# Patient Record
Sex: Female | Born: 1970 | Race: White | Hispanic: No | Marital: Married | Smoking: Never smoker
Health system: Southern US, Community
[De-identification: ages and names within clinical notes are randomized; demographics above are authoritative.]

---
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
## 1994-03-11 HISTORY — PX: OVARIAN CYST REMOVAL: SHX89

## 1999-05-24 ENCOUNTER — Inpatient Hospital Stay (HOSPITAL_COMMUNITY): Admission: AD | Admit: 1999-05-24 | Discharge: 1999-05-24 | Payer: Self-pay | Admitting: *Deleted

## 1999-08-29 ENCOUNTER — Inpatient Hospital Stay (HOSPITAL_COMMUNITY): Admission: AD | Admit: 1999-08-29 | Discharge: 1999-08-31 | Payer: Self-pay | Admitting: *Deleted

## 2000-11-21 ENCOUNTER — Other Ambulatory Visit: Admission: RE | Admit: 2000-11-21 | Discharge: 2000-11-21 | Payer: Self-pay | Admitting: *Deleted

## 2003-02-02 ENCOUNTER — Other Ambulatory Visit: Admission: RE | Admit: 2003-02-02 | Discharge: 2003-02-02 | Payer: Self-pay | Admitting: *Deleted

## 2003-08-09 ENCOUNTER — Inpatient Hospital Stay (HOSPITAL_COMMUNITY): Admission: AD | Admit: 2003-08-09 | Discharge: 2003-08-11 | Payer: Self-pay | Admitting: *Deleted

## 2003-09-27 ENCOUNTER — Other Ambulatory Visit: Admission: RE | Admit: 2003-09-27 | Discharge: 2003-09-27 | Payer: Self-pay | Admitting: Obstetrics and Gynecology

## 2007-12-11 ENCOUNTER — Encounter: Admission: RE | Admit: 2007-12-11 | Discharge: 2007-12-11 | Payer: Self-pay | Admitting: Family Medicine

## 2012-07-23 DIAGNOSIS — G43909 Migraine, unspecified, not intractable, without status migrainosus: Secondary | ICD-10-CM | POA: Insufficient documentation

## 2012-07-23 DIAGNOSIS — D693 Immune thrombocytopenic purpura: Secondary | ICD-10-CM | POA: Insufficient documentation

## 2016-05-16 ENCOUNTER — Encounter: Payer: Self-pay | Admitting: Osteopathic Medicine

## 2016-05-16 ENCOUNTER — Ambulatory Visit (INDEPENDENT_AMBULATORY_CARE_PROVIDER_SITE_OTHER): Payer: BC Managed Care – PPO | Admitting: Osteopathic Medicine

## 2016-05-16 ENCOUNTER — Ambulatory Visit (INDEPENDENT_AMBULATORY_CARE_PROVIDER_SITE_OTHER): Payer: BC Managed Care – PPO

## 2016-05-16 VITALS — BP 145/85 | HR 82 | Ht 67.0 in | Wt 179.0 lb

## 2016-05-16 DIAGNOSIS — K828 Other specified diseases of gallbladder: Secondary | ICD-10-CM

## 2016-05-16 DIAGNOSIS — R7989 Other specified abnormal findings of blood chemistry: Secondary | ICD-10-CM

## 2016-05-16 DIAGNOSIS — R1011 Right upper quadrant pain: Secondary | ICD-10-CM

## 2016-05-16 DIAGNOSIS — K8050 Calculus of bile duct without cholangitis or cholecystitis without obstruction: Secondary | ICD-10-CM

## 2016-05-16 LAB — CBC WITH DIFFERENTIAL/PLATELET
BASOS PCT: 0 %
Basophils Absolute: 0 cells/uL (ref 0–200)
EOS PCT: 1 %
Eosinophils Absolute: 68 cells/uL (ref 15–500)
HCT: 41.8 % (ref 35.0–45.0)
Hemoglobin: 14.1 g/dL (ref 11.7–15.5)
Lymphocytes Relative: 41 %
Lymphs Abs: 2788 cells/uL (ref 850–3900)
MCH: 31.3 pg (ref 27.0–33.0)
MCHC: 33.7 g/dL (ref 32.0–36.0)
MCV: 92.9 fL (ref 80.0–100.0)
MONOS PCT: 7 %
MPV: 11.3 fL (ref 7.5–12.5)
Monocytes Absolute: 476 cells/uL (ref 200–950)
NEUTROS ABS: 3468 {cells}/uL (ref 1500–7800)
Neutrophils Relative %: 51 %
PLATELETS: 170 10*3/uL (ref 140–400)
RBC: 4.5 MIL/uL (ref 3.80–5.10)
RDW: 13.7 % (ref 11.0–15.0)
WBC: 6.8 10*3/uL (ref 3.8–10.8)

## 2016-05-16 NOTE — Progress Notes (Signed)
HPI: Amy Stafford is a 46 y.o. female  who [facility], 05/[facility]/16,  for chief complaint of:  Chief Complaint  Patient presents with  . Establish Care    RUQ PAIN    . Location: RUQ radiating into RLQ and occasionally into R shoulder  . Quality: sore, bloating . Duration: 2 years total, on and off. Worse over past 2 weeks.  . Modifying factors: worse w/ fatty foods  . Assoc signs/symptoms: Occasional constipation.     Past medical, surgical, social and family history reviewed: Patient Active Problem List   Diagnosis Date Noted  . Idiopathic thrombocytopenic purpura (HCC) 07/23/2012  . Migraine 07/23/2012   No past surgical history on file. Social History  Substance Use Topics  . Smoking status: Not on file  . Smokeless tobacco: Not on file  . Alcohol use Not on file   No family history on file.   Current medication list and allergy/intolerance information reviewed:   No current outpatient prescriptions on file.   No current facility-administered medications for this visit.    Allergies not on file    Review of Systems:  Constitutional:  No  fever, no chills, No recent illness, No unintentional weight changes. No significant fatigue.   HEENT: No  headache, no vision change, no hearing change, No sore throat, No  sinus pressure  Cardiac: No  chest pain, No  pressure, No palpitations  Respiratory:  No  shortness of breath. No  Cough  Gastrointestinal: No  abdominal pain, No  nausea  Musculoskeletal: No new myalgia/arthralgia  Skin: No  Rash, No other wounds/concerning lesions   Neurologic: No  weakness, No  dizziness  Psychiatric: No  concerns with depression, No  concerns with anxiety, No sleep problems, No mood problems  Exam:  BP (!) 145/85   Pulse 82   Ht 5\' 7"  (1.702 m)   Wt 179 lb (81.2 kg)   BMI 28.04 kg/m   Constitutional: VS see above. General Appearance: alert, well-developed,  well-nourished, NAD  Eyes: Normal lids and conjunctive, non-icteric sclera  Ears, Nose, Mouth, Throat: MMM, Normal external inspection ears/nares/mouth/lips/gums.   Neck: No masses, trachea midline. No thyroid enlargement.  Respiratory: Normal respiratory effort. no wheeze, no rhonchi, no rales  Cardiovascular: S1/S2 normal, no murmur, no rub/gallop auscultated. RRR.  Gastrointestinal: Nontender, no masses. No hepatomegaly, no splenomegaly. No hernia appreciated. Bowel sounds normal. Rectal exam deferred.   Musculoskeletal: Gait normal. No clubbing/cyanosis of digits.   Neurological: Normal balance/coordination. No tremor.  Skin: warm, dry, intact. No rash/ulcer.  Psychiatric: Normal judgment/insight. Normal mood and affect. Oriented x3.    ASSESSMENT/PLAN:   RUQ pain - Plan: US Abdomen Limited RUQ, CBC with Differential/Platelet, COMPLETE METABOLIC PANEL WITH GFR, TSH, Gamma GT    Visit summary with medication list and pertinent instructions was printed for patient to review. All questions at time of visit were answered - patient instructed to contact office with any additional concerns. ER/RTC precautions were reviewed with the patient. Follow-up plan: Return if symptoms worsen or fail to improve.

## 2016-05-17 LAB — GAMMA GT: GGT: 19 U/L (ref 7–51)

## 2016-05-17 LAB — COMPLETE METABOLIC PANEL WITH GFR
ALT: 13 U/L (ref 6–29)
AST: 13 U/L (ref 10–35)
Albumin: 4.7 g/dL (ref 3.6–5.1)
Alkaline Phosphatase: 43 U/L (ref 33–115)
BILIRUBIN TOTAL: 0.6 mg/dL (ref 0.2–1.2)
BUN: 15 mg/dL (ref 7–25)
CHLORIDE: 102 mmol/L (ref 98–110)
CO2: 23 mmol/L (ref 20–31)
Calcium: 9.8 mg/dL (ref 8.6–10.2)
Creat: 0.92 mg/dL (ref 0.50–1.10)
GFR, EST NON AFRICAN AMERICAN: 75 mL/min (ref >=60)
GFR, Est African American: 86 mL/min (ref >=60)
GLUCOSE: 80 mg/dL (ref 65–99)
Potassium: 4 mmol/L (ref 3.5–5.3)
SODIUM: 139 mmol/L (ref 135–146)
TOTAL PROTEIN: 7.3 g/dL (ref 6.1–8.1)

## 2016-05-17 LAB — TSH: TSH: 2.14 mIU/L

## 2016-05-17 NOTE — Progress Notes (Unsigned)
Called home phone number - Reviewed US results, given biliary colic symptoms and low risk for surgery, will go ahead and refer to gen surg to see if candidate for elective cholecystectomy

## 2018-09-01 IMAGING — US US ABDOMEN LIMITED
1 series · 14 of 25 positions shown · non-contrast
Comparison: None.

CLINICAL DATA: Elevated LFTs, right upper quadrant discomfort

EXAM:
US ABDOMEN LIMITED - RIGHT UPPER QUADRANT

[Series 1: us abdomen limited · 0.15mm/px · 14 of 49 slices shown]
[im 1/49]
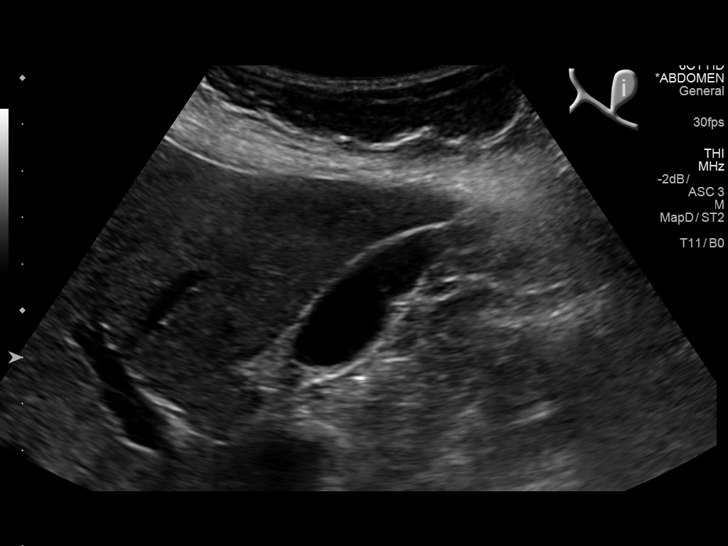
[im 5/49]
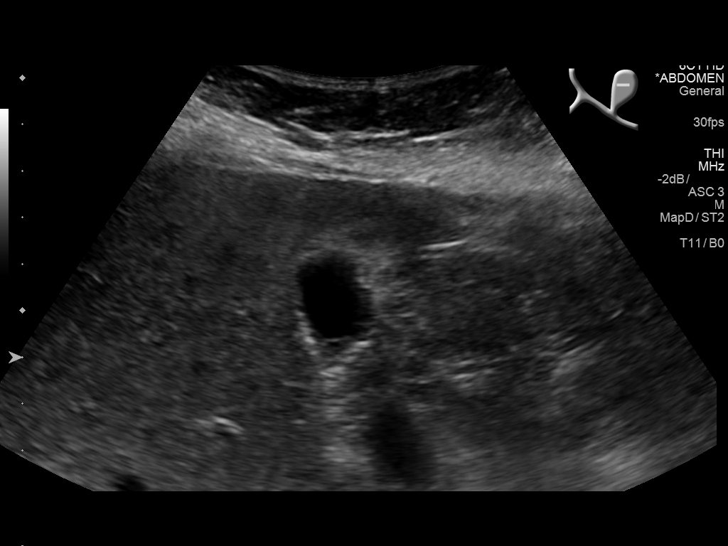
[im 9/49]
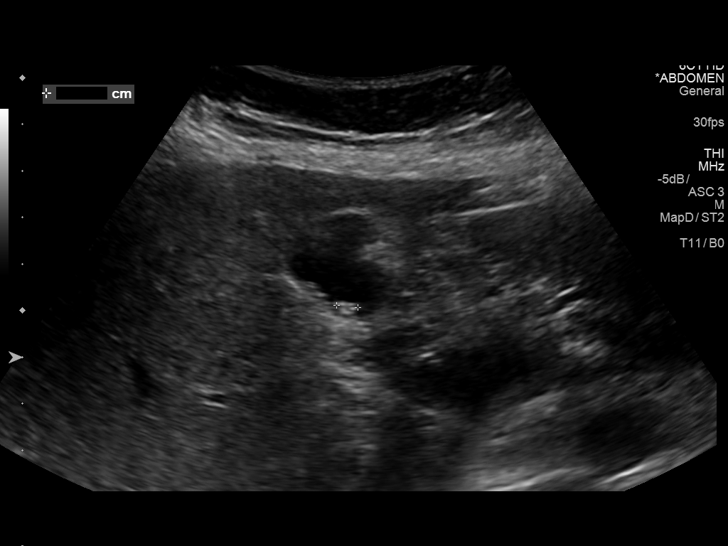
[im 13/49]
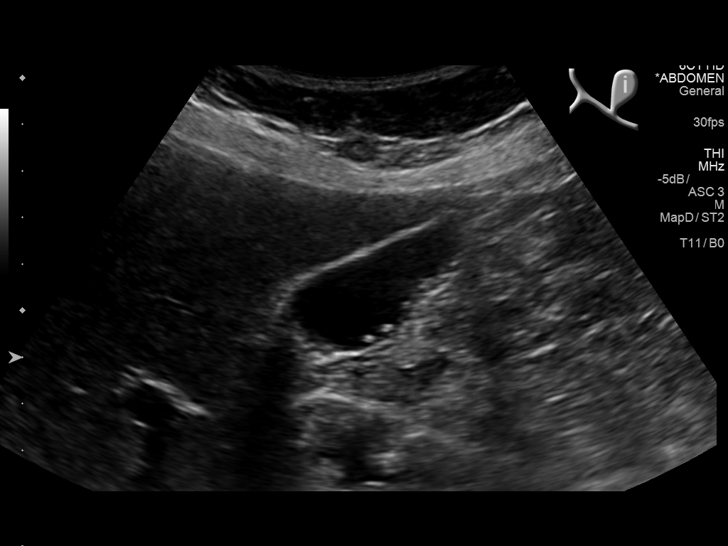
[im 17/49]
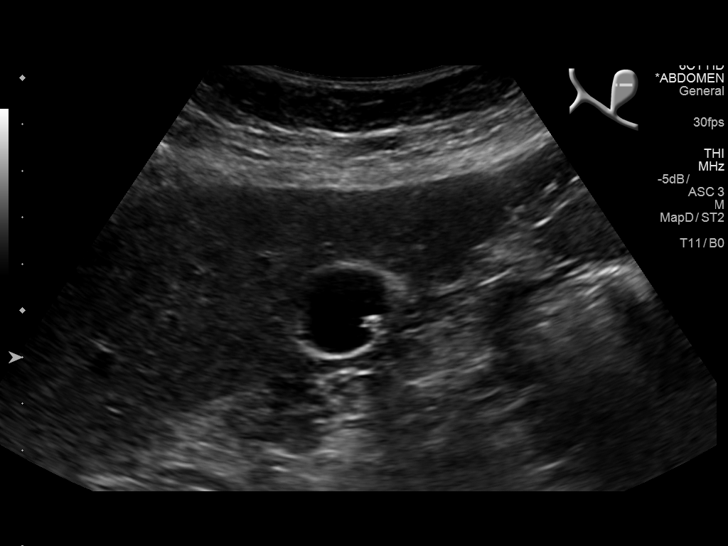
[im 19/49]
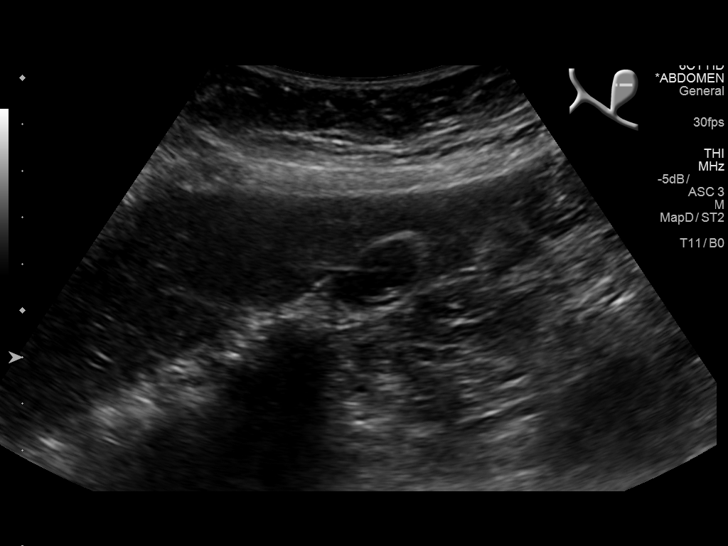
[im 23/49]
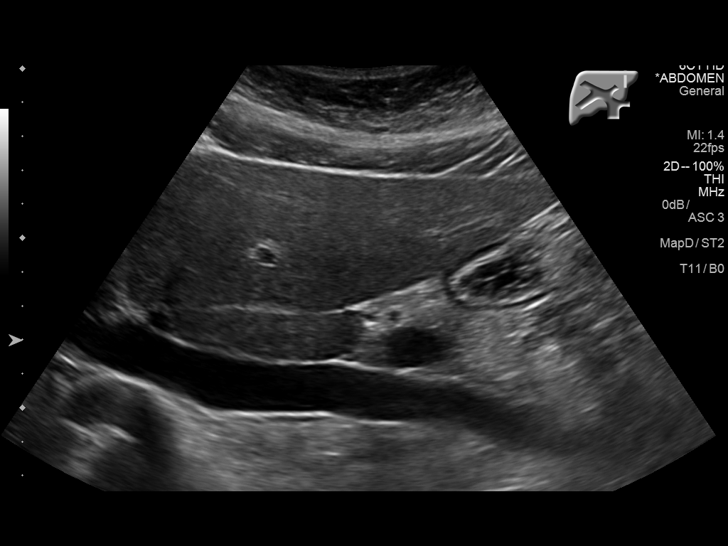
[im 27/49]
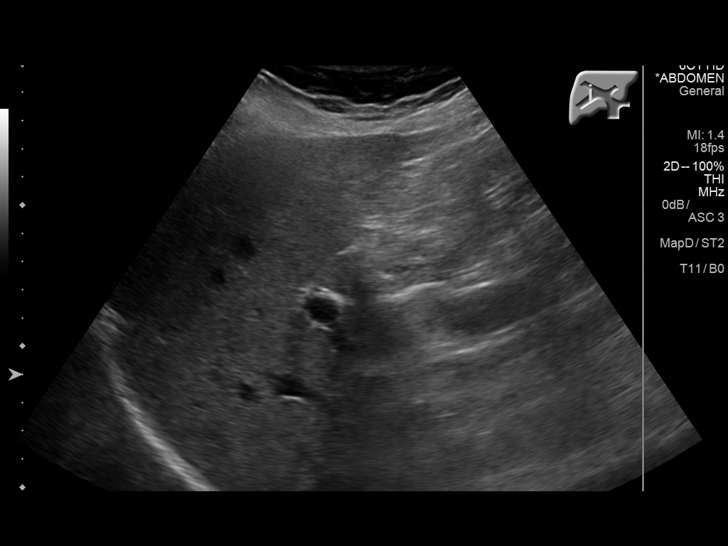
[im 31/49]
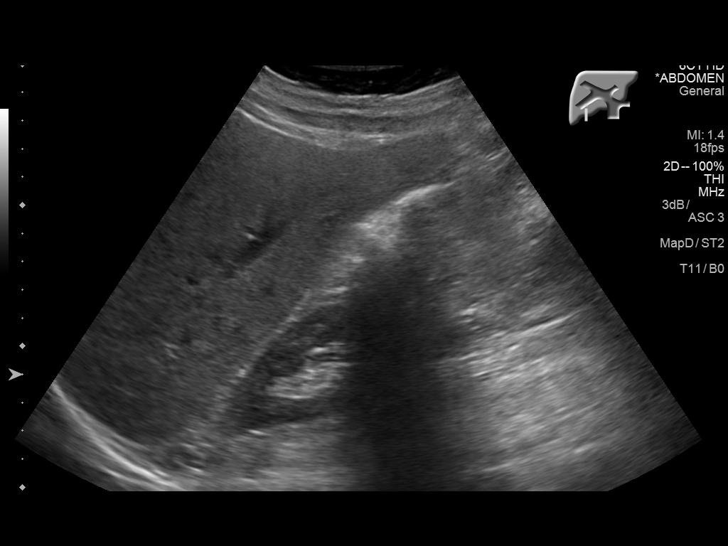
[im 33/49]
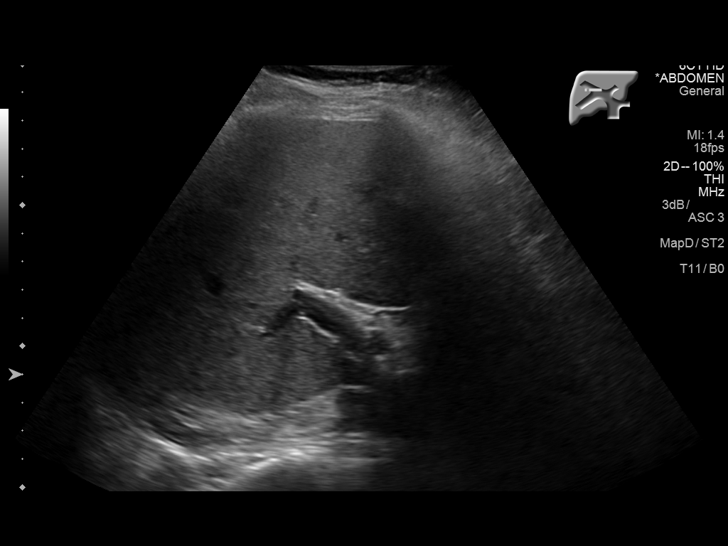
[im 37/49]
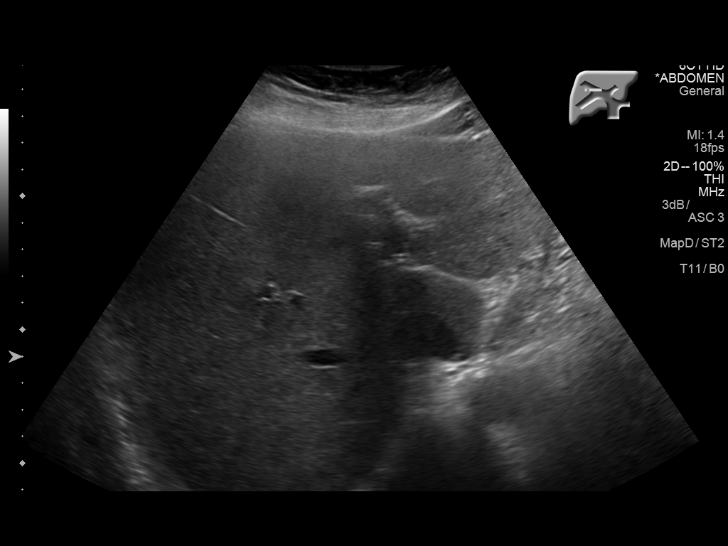
[im 41/49]
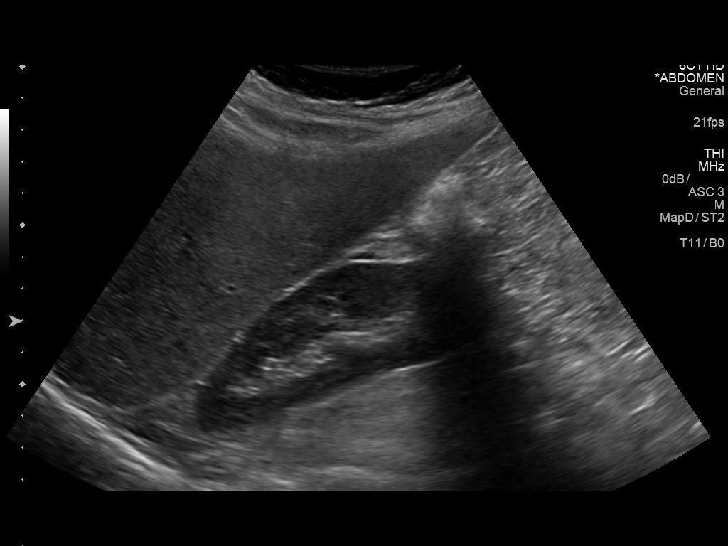
[im 45/49]
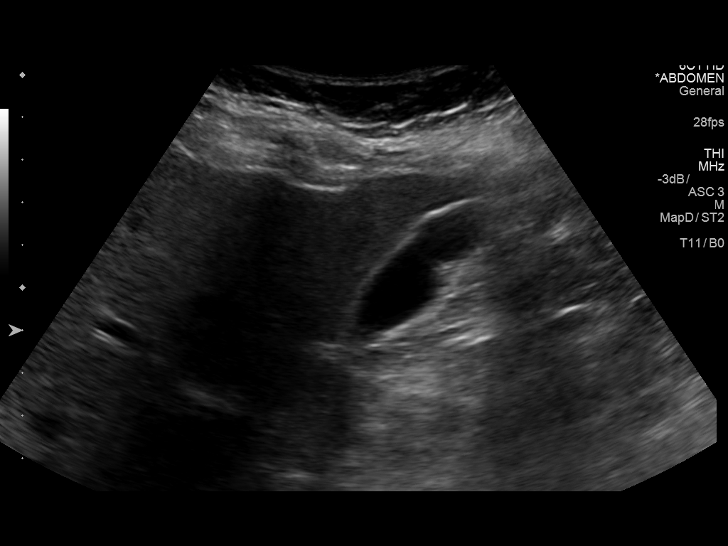
[im 49/49]
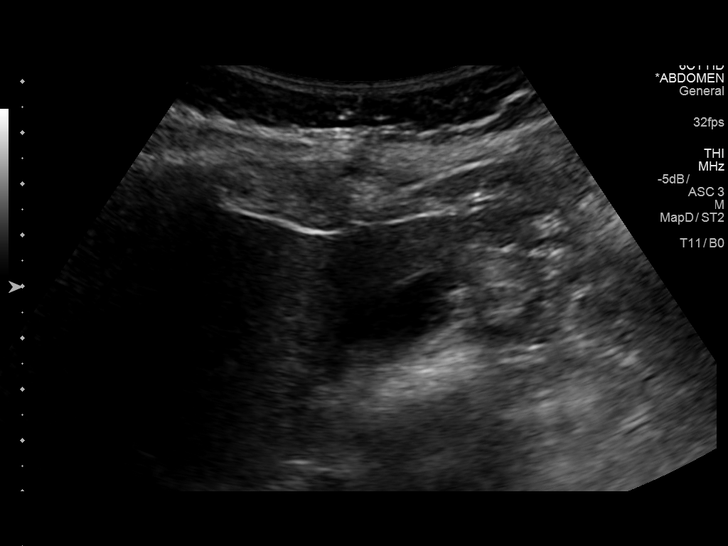

[14 of 25 positions shown; findings below may reference images not displayed]

FINDINGS: Gallbladder:

Multiple mobile echogenic foci nonshadowing within gallbladder
probable noncalcified gallstones or sludge balls the largest
measures 5 mm. No thickening of gallbladder wall. No sonographic
Murphy's sign.

Common bile duct:

Diameter: 2 mm in diameter within normal limits

Liver:

No focal lesion identified. Within normal limits in parenchymal
echogenicity.
IMPRESSION: Multiple mobile echogenic foci within gallbladder probable
noncalcified gallstones or sludge balls the largest measures 5 mm.
No thickening of gallbladder wall. No sonographic Murphy's sign. No
pericholecystic fluid. Normal CBD. No focal hepatic mass.
